# Patient Record
Sex: Female | Born: 1995 | Race: White | Hispanic: No | Marital: Single
Health system: Southern US, Community
[De-identification: ages and names within clinical notes are randomized; demographics above are authoritative.]

---
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
## 2017-06-24 ENCOUNTER — Telehealth: Payer: Self-pay | Admitting: Internal Medicine

## 2017-06-24 NOTE — Telephone Encounter (Signed)
Dr. Jones, °Would you be willing to see this patient to establish care? ° °

## 2017-06-24 NOTE — Telephone Encounter (Signed)
Yes, I will see her 

## 2017-06-24 NOTE — Telephone Encounter (Signed)
Copied from CRM [id number]. Topic: Appointment Scheduling - Prior Auth Required for Appointment >> Jun 24, 2017 11:14 AM Louie Bun, Rosey Bath D wrote: No appointment has been scheduled. Patient is requesting NP appointment with Dr. Yetta Barre. Mom is a current patient of his [facility]. Per scheduling protocol, this appointment requires a prior authorization prior to scheduling.  Route to department's PEC pool.

## 2017-06-25 NOTE — Telephone Encounter (Signed)
Called and informed patient's mother. They will call back to schedule.

## 2017-06-28 ENCOUNTER — Other Ambulatory Visit: Payer: Self-pay

## 2017-06-28 ENCOUNTER — Encounter (HOSPITAL_COMMUNITY): Payer: Self-pay | Admitting: Student

## 2017-06-28 ENCOUNTER — Emergency Department (HOSPITAL_COMMUNITY)
Admission: EM | Admit: 2017-06-28 | Discharge: 2017-06-28 | Disposition: A | Discharge status: Home / Self Care | Payer: Self-pay | Attending: Emergency Medicine | Admitting: Emergency Medicine

## 2017-06-28 DIAGNOSIS — J0100 Acute maxillary sinusitis, unspecified: Secondary | ICD-10-CM | POA: Insufficient documentation

## 2017-06-28 DIAGNOSIS — J029 Acute pharyngitis, unspecified: Secondary | ICD-10-CM | POA: Insufficient documentation

## 2017-06-28 DIAGNOSIS — J342 Deviated nasal septum: Secondary | ICD-10-CM | POA: Insufficient documentation

## 2017-06-28 LAB — GROUP A STREP BY PCR: Group A Strep by PCR: NOT DETECTED

## 2017-06-28 MED ORDER — KETOROLAC TROMETHAMINE 15 MG/ML IJ SOLN
15.0000 mg | Freq: Once | INTRAMUSCULAR | Status: AC
  Administered 2017-06-28: 15 mg via INTRAMUSCULAR
  Filled 2017-06-28: qty 1

## 2017-06-28 NOTE — ED Triage Notes (Signed)
Patient to ED c/o head pressure, congestion, sore throat for about 8-9 days. Taking steroid and antibiotic already. Fevers off and on. No obvious facial swelling noted, lung sounds clear bilaterally.

## 2017-06-28 NOTE — ED Provider Notes (Signed)
Patient placed in Quick Look pathway, seen and evaluated   Chief Complaint: Nasal congestion, sore throat  HPI:   Patient presented to ED with her mother with complaints of significant nasal congestion for about a week.  She states her nose is significantly congested and she is unable to breathe through it.  Reports associated sinus pressure and headache.  States her throat is scratchy and sore from having to breathe through her mouth.  States her ears feel full.  Denies difficulty breathing through her mouth or swallowing.  Reports intermittent fevers.  Was evaluated at the [facility] and prescribed Augmentin 3 days ago as well as instructed to take over-the-counter Mucinex and Sudafed.  She states yesterday she spoke with the tele-doctor through her insurance who instructed her to discontinue taking Mucinex and Sudafed and prescribed her steroid Dosepak.  Reports today with persistent symptoms.  ROS: +nasal congestion, + sore throat  Physical Exam:   Gen: No distress  Neuro: Awake and Alert  Skin: Warm    Focused Exam: Normal work of breathing.  Lungs clear to auscultation bilaterally.  Bilateral maxillary sinuses with tenderness.  Pharynx is mildly erythematous without exudates or petechia.  TMs clear.   Initiation of care has begun. The patient has been counseled on the process, plan, and necessity for staying for the completion/evaluation, and the remainder of the medical screening examination    Dimitriy Carreras, [place] N, PA-C 06/28/17 1514    Arby BarrettePfeiffer, Marcy, MD 07/02/17 1545

## 2017-06-28 NOTE — ED Provider Notes (Signed)
[facility] EMERGENCY DEPARTMENT Provider Note   CSN: [id number] Arrival date & time: 06/28/17  1441     History   Chief Complaint No chief complaint on file.   HPI Amanda Sutton is a 22 y.o. female presented for evaluation of nasal congestion, sore throat, and ear pain.  Patient states for the past 8 to 9 days, she has had significant nasal congestion, sore throat, ear pain.  She is currently on Augmentin and a steroid Dosepak.  She is using Flonase and Claritin.  She takes no other medications daily.  She reports that she has not been eating or drinking well due to pain.  She reports intermittent fevers, has not been taking her temperature, but states they are "low-grade."  She has a deviated septum, has 35% space available, sees an ENT doctor in [place].  She has not had the septum evaluated recently, states that she will do so soon.  She denies sick contacts.  She takes birth control, no other medications daily.  She has no other medical problems.  She denies difficulty breathing, chest pain, shortness of breath, nausea, vomiting, abdominal pain, urinary symptoms, abnormal bowel movements.  HPI  History reviewed. No pertinent past medical history.  There are no active problems to display for this patient.   History reviewed. No pertinent surgical history.   OB History   None      Home Medications    Prior to Admission medications   Not on File    Family History No family history on file.  Social History Social History   Tobacco Use  . Smoking status: Not on file  Substance Use Topics  . Alcohol use: Not on file  . Drug use: Not on file     Allergies   Patient has no allergy information on record.   Review of Systems Review of Systems  Constitutional: Positive for fever (Subjective).  HENT: Positive for congestion, ear pain and sore throat.   Respiratory: Negative for cough.   Cardiovascular: Negative for chest pain.     Physical  Exam Updated Vital Signs BP (!) 137/91 (BP Location: Right Arm)   Pulse (!) 116   Temp 99 F (37.2 C) (Oral)   Resp (!) 22   SpO2 100%   Physical Exam  Constitutional: She is oriented to person, place, and time. She appears well-developed and well-nourished. No distress.  Patient appears uncomfortable, but in no apparent distress  HENT:  Head: Normocephalic and atraumatic.  Right Ear: Tympanic membrane, external ear and ear canal normal.  Left Ear: Tympanic membrane, external ear and ear canal normal.  Nose: Mucosal edema and septal deviation present. Right sinus exhibits maxillary sinus tenderness. Left sinus exhibits maxillary sinus tenderness.  Mouth/Throat: Uvula is midline, oropharynx is clear and moist and mucous membranes are normal. Tonsils are 3+ on the right. Tonsils are 3+ on the left. No tonsillar exudate.  Nasal mucosal edema and septal deviation towards the right.  Tenderness to palpation maxillary sinuses.  Bilateral tonsillar swelling without exudate or erythema.  Handling secretions easily.  No airway compromise.  TMs nonerythematous and nonbulging bilaterally.  Eyes: Pupils are equal, round, and reactive to light. Conjunctivae and EOM are normal.  Neck: Normal range of motion.  Cardiovascular: Normal rate, regular rhythm and intact distal pulses.  Mildly tachycardic  Pulmonary/Chest: Effort normal and breath sounds normal. No respiratory distress. She has no decreased breath sounds. She has no wheezes. She has no rhonchi. She has no  rales.  Pt speaking in full sentences.  Clear lung sounds in all fields  Abdominal: Soft. She exhibits no distension. There is no tenderness.  Musculoskeletal: Normal range of motion.  Lymphadenopathy:    She has no cervical adenopathy.  Neurological: She is alert and oriented to person, place, and time.  Skin: Skin is warm.  Psychiatric: She has a normal mood and affect.  Nursing note and vitals reviewed.    ED Treatments / Results    Labs (all labs ordered are listed, but only abnormal results are displayed) Labs Reviewed  GROUP A STREP BY PCR    EKG None  Radiology No results found.  Procedures Procedures (including critical care time)  Medications Ordered in ED Medications  ketorolac (TORADOL) 15 MG/ML injection 15 mg (15 mg Intramuscular Given 06/28/17 1839)     Initial Impression / Assessment and Plan / ED Course  I have reviewed the triage vital signs and the nursing notes.  Pertinent labs & imaging results that were available during my care of the patient were reviewed by me and considered in my medical decision making (see chart for details).     Patient presenting with 9 days sinusitis symptoms.  Physical exam reassuring, patient is afebrile and appears nontoxic. Mildly tachycardic, likely due to dehydration, as pt states she has not been drinking water.  Pulmonary exam reassuring.  Doubt pneumonia, strep, other bacterial infection, or peritonsillar abscess. Rapid strep negative.  Likely sinusitis. Already on abx and steroids. Will add afrin, discussed use for 3 days and concerns for rebound congestion. Encouraged f/u with ENT.  At this time, patient appears safe for discharge.  Return precautions given.  Patient states she understands and agrees to plan.   Final Clinical Impressions(s) / ED Diagnoses   Final diagnoses:  Acute maxillary sinusitis, recurrence not specified    ED Discharge Orders    None       Alveria ApleyCaccavale, Noriko Macari, PA-C 06/28/17 1940    Mancel BaleWentz, Elliott, MD 07/06/17 [phone]

## 2017-06-28 NOTE — Discharge Instructions (Addendum)
Continue taking the antibiotics and steroids. Use Afrin twice a day for the next 3 days.  Make sure you do not use this longer than 3 days, as this can cause worsening symptoms and to stop. You may use honey or sore throat spray as needed for sore throat. Make sure you are staying well-hydrated with water.  This is very important. Follow-up with the ENT doctor tomorrow as needed. Return to the emergency room if you develop any new or concerning symptoms.

## 2017-06-28 NOTE — ED Notes (Addendum)
Patient breathing normally and in no apparent distress at this time.

## 2019-01-16 ENCOUNTER — Ambulatory Visit: Payer: HRSA Program | Attending: Internal Medicine

## 2019-01-16 ENCOUNTER — Other Ambulatory Visit: Payer: Self-pay

## 2019-01-16 DIAGNOSIS — Z20828 Contact with and (suspected) exposure to other viral communicable diseases: Secondary | ICD-10-CM | POA: Diagnosis present

## 2019-01-16 DIAGNOSIS — Z20822 Contact with and (suspected) exposure to covid-19: Secondary | ICD-10-CM

## 2019-01-16 DIAGNOSIS — U071 COVID-19: Secondary | ICD-10-CM | POA: Insufficient documentation

## 2019-01-17 LAB — NOVEL CORONAVIRUS, NAA: SARS-CoV-2, NAA: DETECTED — AB
